# Patient Record
Sex: Male | Born: 1960 | Race: White | Hispanic: No | Marital: Single
Health system: Southern US, Community
[De-identification: ages and names within clinical notes are randomized; demographics above are authoritative.]

---

## 2019-02-13 ENCOUNTER — Emergency Department (HOSPITAL_COMMUNITY)
Admission: EM | Admit: 2019-02-13 | Discharge: 2019-02-13 | Disposition: A | Discharge status: Home / Self Care | Payer: Medicare Other | Attending: Emergency Medicine | Admitting: Emergency Medicine

## 2019-02-13 ENCOUNTER — Other Ambulatory Visit: Payer: Self-pay

## 2019-02-13 DIAGNOSIS — E875 Hyperkalemia: Secondary | ICD-10-CM | POA: Insufficient documentation

## 2019-02-13 DIAGNOSIS — N186 End stage renal disease: Secondary | ICD-10-CM

## 2019-02-13 DIAGNOSIS — Z992 Dependence on renal dialysis: Secondary | ICD-10-CM | POA: Insufficient documentation

## 2019-02-13 LAB — I-STAT CHEM 8, ED
BUN: 118 mg/dL — ABNORMAL HIGH (ref 6–20)
Calcium, Ion: 1.18 mmol/L (ref 1.15–1.40)
Chloride: 100 mmol/L (ref 98–111)
Creatinine, Ser: 13.6 mg/dL — ABNORMAL HIGH (ref 0.61–1.24)
Glucose, Bld: 86 mg/dL (ref 70–99)
HCT: 25 % — ABNORMAL LOW (ref 39.0–52.0)
Hemoglobin: 8.5 g/dL — ABNORMAL LOW (ref 13.0–17.0)
Potassium: 6.3 mmol/L (ref 3.5–5.1)
Sodium: 132 mmol/L — ABNORMAL LOW (ref 135–145)
TCO2: 21 mmol/L — ABNORMAL LOW (ref 22–32)

## 2019-02-13 MED ORDER — SODIUM ZIRCONIUM CYCLOSILICATE 10 G PO PACK
10.0000 g | PACK | Freq: Once | ORAL | Status: AC
  Administered 2019-02-13: 10 g via ORAL
  Filled 2019-02-13: qty 1

## 2019-02-13 NOTE — ED Provider Notes (Signed)
Emergency Department Provider Note   I have reviewed the triage vital signs and the nursing notes.   HISTORY  Chief Complaint Vascular Access Problem   HPI Norman Bright is a 58 y.o. male who presents to the emergency department today to receive dialysis.  Patient was diagnosed with coronavirus last week and was moved to [facility] here in [place] which is a Covid hospital apparently.  He missed dialysis on Friday, Saturday and today but is scheduled tomorrow morning.  They sent him here because he has had any labs done in a while.  Patient has no complaints.   No other associated or modifying symptoms.    No past medical history on file.  There are no active problems to display for this patient.  Allergies Patient has no allergy information on record.  No family history on file.  Social History Social History   Tobacco Use  . Smoking status: Not on file  Substance Use Topics  . Alcohol use: Not on file  . Drug use: Not on file    Review of Systems  All other systems negative except as documented in the HPI. All pertinent positives and negatives as reviewed in the HPI. ____________________________________________   PHYSICAL EXAM:  VITAL SIGNS: ED Triage Vitals  Enc Vitals Group     BP 02/13/19 1800 (!) 116/48     Pulse Rate 02/13/19 1800 61     Resp 02/13/19 1900 12     Temp --      Temp src --      SpO2 02/13/19 1744 99 %    Constitutional: Alert and oriented. Well appearing and in no acute distress. Eyes: Conjunctivae are normal. PERRL. EOMI. Head: Atraumatic. Nose: No congestion/rhinnorhea. Mouth/Throat: Mucous membranes are moist.  Oropharynx non-erythematous. Neck: No stridor.  No meningeal signs.   Cardiovascular: Normal rate, regular rhythm. Good peripheral circulation. Grossly normal heart sounds.   Respiratory: Normal respiratory effort.  No retractions. Lungs CTAB. Gastrointestinal: Soft and nontender. No distention.   Musculoskeletal: No lower extremity tenderness nor edema. No gross deformities of extremities. Neurologic:  Normal speech and language. No gross focal neurologic deficits are appreciated.  Skin:  Skin is warm, dry and intact. No rash noted.   ____________________________________________   LABS (all labs ordered are listed, but only abnormal results are displayed)  Labs Reviewed  I-STAT CHEM 8, ED - Abnormal; Notable for the following components:      Result Value   Sodium 132 (*)    Potassium 6.3 (*)    BUN 118 (*)    Creatinine, Ser 13.60 (*)    TCO2 21 (*)    Hemoglobin 8.5 (*)    HCT 25.0 (*)    All other components within normal limits   ____________________________________________  EKG  My ECG Read Indication:hyperkalemia EKG was personally contemporaneously reviewed by myself. Rate: 58 PR Interval: 120 QRS duration: 111 QT/QTC: 461/453 Axis: normal EKG: normal EKG, normal sinus rhythm, there are no previous tracings available for comparison. Other significant findings: no changes c/w hyperkalemia  ____________________________________________  INITIAL IMPRESSION / ASSESSMENT AND PLAN / ED COURSE  Discussed with Dr. Joelyn Oms and no ecg changes or resp symptoms, will give lokelma. Also stated he was able to check and the patient is on the schedule for dialysis in the morning.   Pertinent labs & imaging results that were available during my care of the patient were reviewed by me and considered in my medical decision making (see chart for details).  A medical screening exam was performed and I feel the patient has had an appropriate workup for their chief complaint at this time and likelihood of emergent condition existing is low. They have been counseled on decision, discharge, follow up and which symptoms necessitate immediate return to the emergency department. They or their family verbally stated understanding and agreement with plan and discharged in stable  condition.   ____________________________________________  FINAL CLINICAL IMPRESSION(S) / ED DIAGNOSES  Final diagnoses:  ESRD (end stage renal disease) ([place])  Hyperkalemia    MEDICATIONS GIVEN DURING THIS VISIT:  Medications  sodium zirconium cyclosilicate (LOKELMA) packet 10 g (10 g Oral Given 02/13/19 1925)     NEW OUTPATIENT MEDICATIONS STARTED DURING THIS VISIT:  There are no discharge medications for this patient.   Note:  This note was prepared with assistance of Dragon voice recognition software. Occasional wrong-word or sound-a-like substitutions may have occurred due to the inherent limitations of voice recognition software.   Merrily Pew, MD 02/13/19 [phone]

## 2019-02-13 NOTE — ED Notes (Signed)
ptar called pt is  Next called by dee

## 2019-02-13 NOTE — ED Triage Notes (Signed)
Pt BIB [facility] from [facility]. Pt here for dialysis. Per EMS pt missed dialysis Friday and Saturday but is scheduled for dialysis tomorrow. VSS. NAD.

## 2019-02-13 NOTE — ED Notes (Signed)
I Stat Chem8 with Potassium of 6.3, called to Dr. Dayna Barker.

## 2019-02-13 NOTE — ED Notes (Signed)
Attempted to call report X2 without success.

## 2019-02-13 NOTE — Discharge Instructions (Addendum)
Your potassium was slightly high however not a dangerous levels.  The medicine we gave you should temporarily hold you over until you can get your dialysis tomorrow.  However it is imperative that you do get it done tomorrow.

## 2019-03-08 ENCOUNTER — Emergency Department (HOSPITAL_COMMUNITY)
Admission: EM | Admit: 2019-03-08 | Discharge: 2019-03-08 | Disposition: A | Discharge status: Home / Self Care | Payer: Medicare Other | Attending: Emergency Medicine | Admitting: Emergency Medicine

## 2019-03-08 ENCOUNTER — Emergency Department (HOSPITAL_COMMUNITY): Payer: Medicare Other

## 2019-03-08 ENCOUNTER — Other Ambulatory Visit: Payer: Self-pay

## 2019-03-08 DIAGNOSIS — K529 Noninfective gastroenteritis and colitis, unspecified: Secondary | ICD-10-CM | POA: Diagnosis not present

## 2019-03-08 DIAGNOSIS — R10814 Left lower quadrant abdominal tenderness: Secondary | ICD-10-CM | POA: Insufficient documentation

## 2019-03-08 DIAGNOSIS — N186 End stage renal disease: Secondary | ICD-10-CM | POA: Insufficient documentation

## 2019-03-08 DIAGNOSIS — Z8709 Personal history of other diseases of the respiratory system: Secondary | ICD-10-CM | POA: Insufficient documentation

## 2019-03-08 DIAGNOSIS — Z992 Dependence on renal dialysis: Secondary | ICD-10-CM | POA: Insufficient documentation

## 2019-03-08 DIAGNOSIS — R197 Diarrhea, unspecified: Secondary | ICD-10-CM

## 2019-03-08 DIAGNOSIS — I959 Hypotension, unspecified: Secondary | ICD-10-CM | POA: Diagnosis present

## 2019-03-08 LAB — CBC WITH DIFFERENTIAL/PLATELET
Abs Immature Granulocytes: 0.01 10*3/uL (ref 0.00–0.07)
Basophils Absolute: 0 10*3/uL (ref 0.0–0.1)
Basophils Relative: 1 %
Eosinophils Absolute: 0.3 10*3/uL (ref 0.0–0.5)
Eosinophils Relative: 4 %
HCT: 29.7 % — ABNORMAL LOW (ref 39.0–52.0)
Hemoglobin: 9.3 g/dL — ABNORMAL LOW (ref 13.0–17.0)
Immature Granulocytes: 0 %
Lymphocytes Relative: 27 %
Lymphs Abs: 1.7 10*3/uL (ref 0.7–4.0)
MCH: 33.1 pg (ref 26.0–34.0)
MCHC: 31.3 g/dL (ref 30.0–36.0)
MCV: 105.7 fL — ABNORMAL HIGH (ref 80.0–100.0)
Monocytes Absolute: 0.6 10*3/uL (ref 0.1–1.0)
Monocytes Relative: 10 %
Neutro Abs: 3.6 10*3/uL (ref 1.7–7.7)
Neutrophils Relative %: 58 %
Platelets: 205 10*3/uL (ref 150–400)
RBC: 2.81 MIL/uL — ABNORMAL LOW (ref 4.22–5.81)
RDW: 17.8 % — ABNORMAL HIGH (ref 11.5–15.5)
WBC: 6.1 10*3/uL (ref 4.0–10.5)
nRBC: 0 % (ref 0.0–0.2)

## 2019-03-08 LAB — COMPREHENSIVE METABOLIC PANEL
ALT: 44 U/L (ref 0–44)
AST: 27 U/L (ref 15–41)
Albumin: 3.6 g/dL (ref 3.5–5.0)
Alkaline Phosphatase: 123 U/L (ref 38–126)
Anion gap: 13 (ref 5–15)
BUN: 16 mg/dL (ref 6–20)
CO2: 26 mmol/L (ref 22–32)
Calcium: 9.6 mg/dL (ref 8.9–10.3)
Chloride: 99 mmol/L (ref 98–111)
Creatinine, Ser: 5.61 mg/dL — ABNORMAL HIGH (ref 0.61–1.24)
GFR calc Af Amer: 12 mL/min — ABNORMAL LOW (ref >60)
GFR calc non Af Amer: 10 mL/min — ABNORMAL LOW (ref >60)
Glucose, Bld: 97 mg/dL (ref 70–99)
Potassium: 3.4 mmol/L — ABNORMAL LOW (ref 3.5–5.1)
Sodium: 138 mmol/L (ref 135–145)
Total Bilirubin: 0.4 mg/dL (ref 0.3–1.2)
Total Protein: 7.5 g/dL (ref 6.5–8.1)

## 2019-03-08 LAB — LACTIC ACID, PLASMA: Lactic Acid, Venous: 1.7 mmol/L (ref 0.5–1.9)

## 2019-03-08 LAB — LIPASE, BLOOD: Lipase: 34 U/L (ref 11–51)

## 2019-03-08 NOTE — ED Provider Notes (Signed)
[place] EMERGENCY DEPARTMENT Provider Note   CSN: [id number] Arrival date & time: 03/08/19  [id number]     History Chief Complaint  Patient presents with   Hypotension   covid    recent covid positive last week- neg yesterday per pt    Norman Bright is a 58 y.o. male.  58 year old male with past medical history including ESRD on HD who p/w low blood pressure.  Patient is a poor historian.  Patient arrives from his nursing facility where staff reported that the patient's blood pressure was low yesterday evening so they held his blood pressure medications but it has continued to be low this morning.  Patient had his usual dialysis yesterday.  Patient has had profuse diarrhea on arrival to the ED and states that he has had this problem before, usually takes Imodium for it.  Facility [facility] documents he is on immodium prn and probiotic daily.  He denies any abdominal pain currently and denies any vomiting, cough, or breathing problems.  Of note, he tested positive for COVID-19 last month and has been on the COVID unit at nursing facility.   The history is provided by the patient, the EMS personnel and the nursing home.       No past medical history on file.  There are no problems to display for this patient.   * The histories are not reviewed yet. Please review them in the "History" navigator section and refresh this [place].   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  Social History   Tobacco Use   Smoking status: Not on file  Substance Use Topics   Alcohol use: Not on file   Drug use: Not on file    Home Medications Prior to Admission medications   Not on File    Allergies    Patient has no allergy information on record.  Review of Systems   Review of Systems All other systems reviewed and are negative except that which was mentioned in HPI  Physical Exam Updated Vital Signs BP (!)  123/57    Pulse (!) 57    Temp 98.3 F (36.8 C) (Oral)    Resp (!) 25    SpO2 100%   Physical Exam Vitals and nursing note reviewed.  Constitutional:      General: He is not in acute distress.    Appearance: He is well-developed.     Comments: Chronically ill appearing, comfortable  HENT:     Head: Normocephalic and atraumatic.  Eyes:     Conjunctiva/sclera: Conjunctivae normal.     Comments: Inversion of L lower eyelid  Cardiovascular:     Rate and Rhythm: Normal rate and regular rhythm.     Heart sounds: Normal heart sounds.  Pulmonary:     Effort: Pulmonary effort is normal.     Breath sounds: Normal breath sounds.  Abdominal:     General: Bowel sounds are normal. There is no distension.     Palpations: Abdomen is soft.     Tenderness: There is abdominal tenderness (LLQ).  Musculoskeletal:     Cervical back: Neck supple.     Comments: B/l BKA and amputation of L 2nd and 3rd fingers  Skin:    General: Skin is warm and dry.  Neurological:     Mental Status: He is alert and oriented to person, place, and time.     Comments: Fluent speech  ED Results / Procedures / Treatments   Labs (all labs ordered are listed, but only abnormal results are displayed) Labs Reviewed  COMPREHENSIVE METABOLIC PANEL - Abnormal; Notable for the following components:      Result Value   Potassium 3.4 (*)    Creatinine, Ser 5.61 (*)    GFR calc non Af Amer 10 (*)    GFR calc Af Amer 12 (*)    All other components within normal limits  CBC WITH DIFFERENTIAL/PLATELET - Abnormal; Notable for the following components:   RBC 2.81 (*)    Hemoglobin 9.3 (*)    HCT 29.7 (*)    MCV 105.7 (*)    RDW 17.8 (*)    All other components within normal limits  GI PATHOGEN PANEL BY PCR, STOOL  C DIFFICILE QUICK SCREEN W PCR REFLEX  LIPASE, BLOOD  LACTIC ACID, PLASMA    EKG EKG Interpretation  Date/Time:  Wednesday March 08 2019 07:54:44 EST Ventricular Rate:  61 PR Interval:    QRS  Duration: 107 QT Interval:  453 QTC Calculation: 457 R Axis:   72 Text Interpretation: Sinus rhythm Short PR interval Abnormal R-wave progression, early transition Nonspecific repol abnormality, diffuse leads Baseline wander in lead(s) V2 artifact limiting interpretation but no obvious significant changes from previous Confirmed by Theotis Burrow [phone]) on 03/08/2019 9:35:52 AM   Radiology CT Abdomen Pelvis Wo Contrast  Result Date: 03/08/2019 CLINICAL DATA:  Left lower quadrant pain and diarrhea EXAM: CT ABDOMEN AND PELVIS WITHOUT CONTRAST TECHNIQUE: Multidetector CT imaging of the abdomen and pelvis was performed following the standard protocol without oral or IV contrast. COMPARISON:  None. FINDINGS: Lower chest: There is ill-defined opacity in the right lower lobe, incompletely visualized, suspicious for pneumonia. Visualized lung bases otherwise are clear except for slight posterior left base atelectasis. There are multiple foci of coronary artery calcification. Hepatobiliary: No focal liver lesions are evident on this noncontrast enhanced study. Gallbladder is not appreciable. There is no biliary duct dilatation. Pancreas: There is no pancreatic mass or inflammatory focus. Spleen: No splenic lesions are evident. Adrenals/Urinary Tract: Adrenals bilaterally appear normal. Kidneys appear small and atrophic bilaterally. There is extensive renal artery calcification including multiple peripheral arterial calcifications in each kidney. There is no renal hydronephrosis on either side. There is a 1 x 1 cm cyst in each kidney. There is slight perinephric stranding on each side. There is no evident renal or ureteral calculus on either side. The urinary bladder is midline. Only a small amount of urine is seen in the bladder. Urinary bladder wall appears mildly thickened. Stomach/Bowel: There is a balloon catheter within the rectum. Rectal wall is not appreciably thickened. There is fluid throughout most  bowel loops. There is no appreciable diverticular disease. There is mild wall thickening involving several loops of proximal jejunum. There is no frank bowel obstruction. There appears to have been previous right-sided colectomy with anastomosis patent. No free air or portal venous air evident. Vascular/Lymphatic: There is no abdominal aortic aneurysm. There is atherosclerotic calcification in the aorta and common iliac arteries. There are multiple peripheral arterial calcifications throughout the abdomen and pelvis. Note that multiple calcifications are noted in peripheral hepatic arterial vessels, splenic artery, and renal arteries. No adenopathy is evident in the abdomen or pelvis. Reproductive: Prostate and seminal vesicles are normal in size and contour. No pelvic mass evident. Other: No abscess or ascites is evident in the abdomen or pelvis. There is scarring in the anterior abdominal wall. Musculoskeletal: No  blastic or lytic bone lesions are evident. No intramuscular lesions evident. IMPRESSION: 1. No diverticulitis evident. Fluid noted in multiple bowel loops, likely due to enterocolitis. Mild wall thickening and several loops of proximal jejunum support suspicion of enteritis. No bowel obstruction evident. Apparent previous right colectomy with anastomosis patent. 2. Apparent pneumonia in the right lower lobe, incompletely visualized. Appearance raises question of atypical organism pneumonia. 3. Kidneys appear atrophic; suspect chronic renal failure. No hydronephrosis. No renal or ureteral calculi. Urinary bladder is largely contracted. Suspect a degree of cystitis given mild urinary bladder wall thickening. 4. Widespread arterial vascular calcification; this finding may be associated with renal failure. Aortic Atherosclerosis (ICD10-I70.0). Electronically Signed   By: Lowella Grip III M.D.   On: 03/08/2019 11:07    Procedures Procedures (including critical care time)  Medications Ordered in  ED Medications - No data to display  ED Course  I have reviewed the triage vital signs and the nursing notes.  Pertinent labs & imaging results that were available during my care of the patient were reviewed by me and considered in my medical decision making (see chart for details).    MDM Rules/Calculators/A&P                      Pt comfortable and alert on exam. BP 115/77, HR 60. He had LLQ abd tenderness although didn't report abd pain. Profuse non-bloody diarrhea per nursing staff. Labs show normal lactate, K 3.4, normal WBC count, Hgb 9.3 similar to previous. Obtained CT abd/pelvis given abd tenderness, to evaluate for diverticulitis.  CT shows no evidence of diverticulitis but he does have fluid-filled bowel loops and wall thickening suggestive of enterocolitis, enteritis.  I ordered GI panel and C. difficile studies but patient had no episodes of diarrhea after initial arrival and we were unable to send the studies.  I attempted to speak with his nurse at [facility] but we were cut off midway through conversation and I was unable to get in touch with anyone else there.  She did say that his diarrhea has been a chronic problem.  I have explained to him that he needs stool studies sent by his primary provider and have written this on his discharge paperwork.  His blood pressure has remained normal throughout his ED course and he is comfortable on reassessment.  Patient discharged back to his nursing facility. Final Clinical Impression(s) / ED Diagnoses Final diagnoses:  Enterocolitis  Diarrhea, unspecified type    Rx / DC Orders ED Discharge Orders    None       Farley Crooker, Wenda Overland, MD 03/08/19 1512

## 2019-03-08 NOTE — ED Triage Notes (Signed)
To ED via [facility] from 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

## 2019-03-08 NOTE — Discharge Instructions (Signed)
Norman Bright BLOOD PRESSURE WAS NORMAL IN THE ED. HIS ABDOMINAL CT SHOWED ENTERCOLITIS, OR INFLAMMATION OF SMALL BOWEL, WHICH IS LIKELY RELATED TO HIS DIARRHEA. HE NEEDS STOOL STUDIES ORDERED TO TEST FOR GASTROINTESTINAL VIRUSES AND BACTERIA, INCLUDING C. DIFF. PLEASE HAVE HIS PRIMARY CARE PROVIDER SEND THESE STUDIES AS SOON AS POSSIBLE.  RETURN TO ER IF ANY FEVER, SEVERE ABDOMINAL PAIN, OR BLOODY STOOLS.

## 2019-03-08 NOTE — ED Notes (Signed)
Attempted report to [facility], transferred to nursing staff from main line however, no answer and no answering service. Phone just rang. Will try again.

## 2019-03-08 NOTE — ED Notes (Signed)
flexiseal was ordered and has been placed at this time.

## 2019-03-08 NOTE — ED Notes (Signed)
PTAR

## 2019-03-08 NOTE — ED Notes (Signed)
Pt was covered in stool on arrival with EMS. This tech cleaned pt and put new brief on pt along with clean sheets and gown.

## 2020-10-21 DEATH — deceased

## 2021-07-15 IMAGING — CT CT ABD-PELV W/O CM
2 of 4 series · 14 of 46 positions shown, 16 images · non-contrast
Comparison: None.

CLINICAL DATA: Left lower quadrant pain and diarrhea

EXAM:
CT ABDOMEN AND PELVIS WITHOUT CONTRAST
TECHNIQUE: Multidetector CT imaging of the abdomen and pelvis was performed
following the standard protocol without oral or IV contrast.

[Series 3: a/p w/o 5mm · axial · non-contrast · 0.76mm/px · z∈[+514,+949]mm · 11 of 105 slices shown, 13 images]
[im 9/105  soft-tissue]
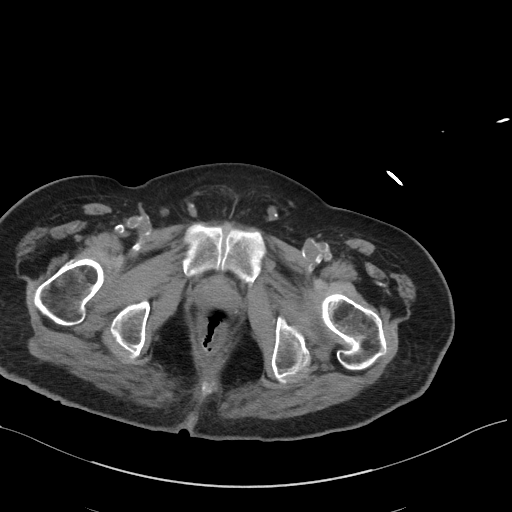
[im 9/105  bone]
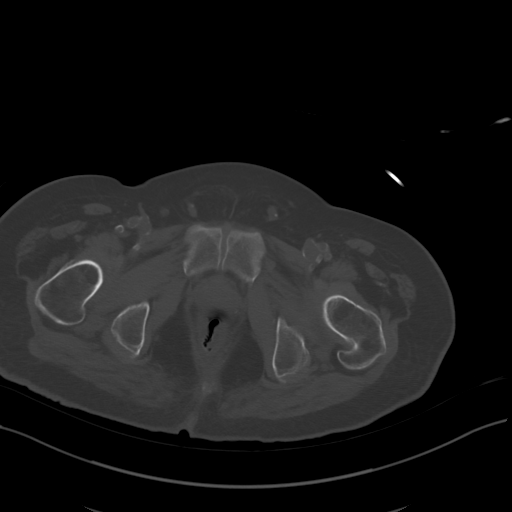
[im 18/105  soft-tissue]
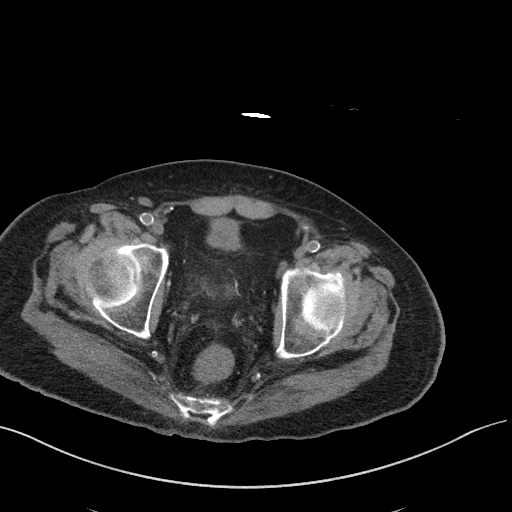
[im 27/105  soft-tissue]
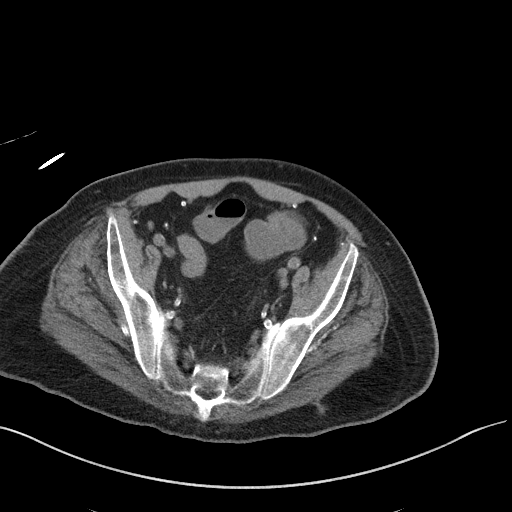
[im 35/105  soft-tissue]
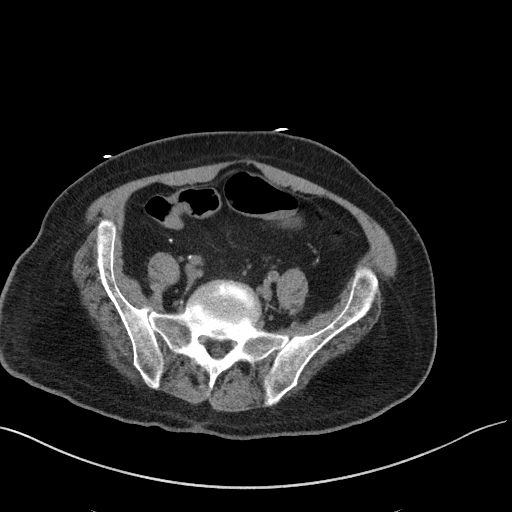
[im 44/105  soft-tissue]
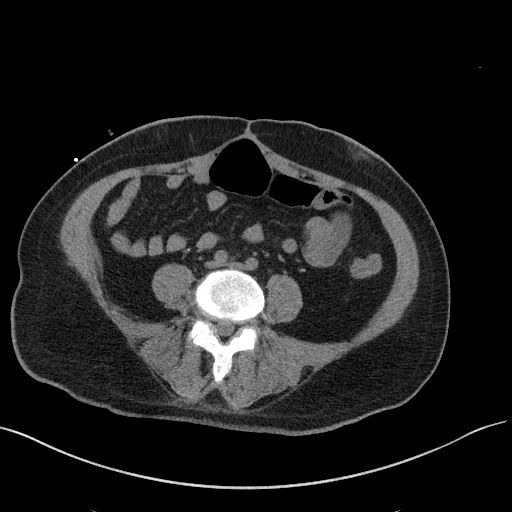
[im 53/105  soft-tissue]
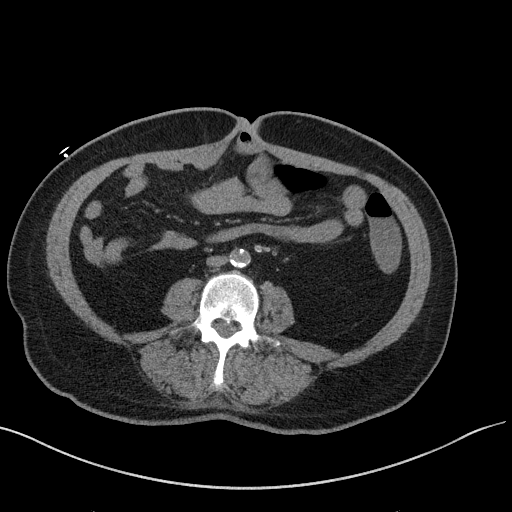
[im 61/105  soft-tissue]
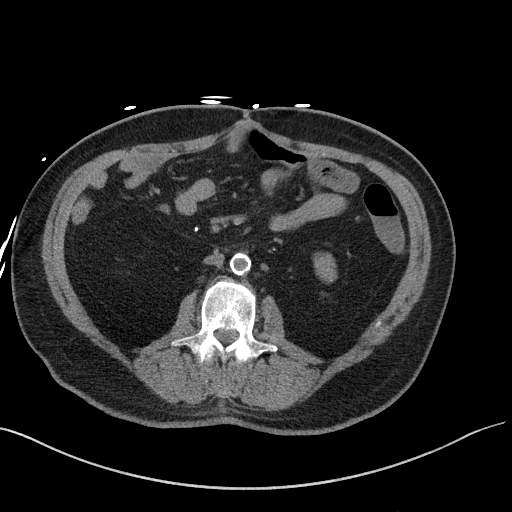
[im 70/105  soft-tissue]
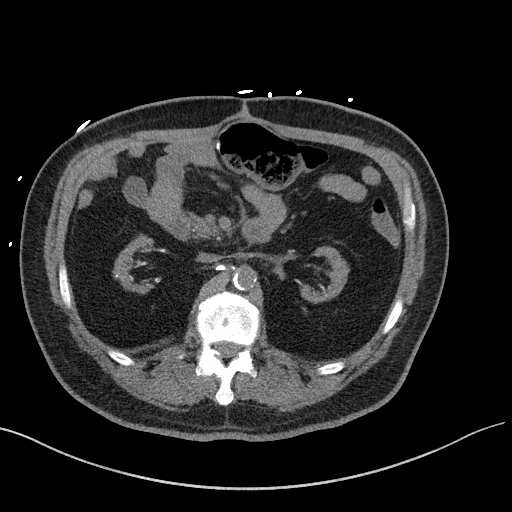
[im 79/105  soft-tissue]
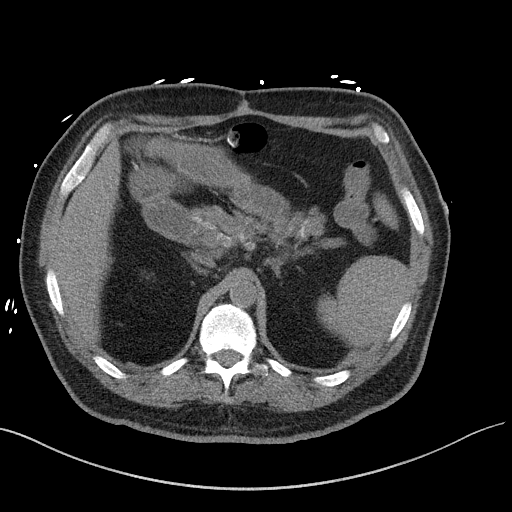
[im 79/105  bone]
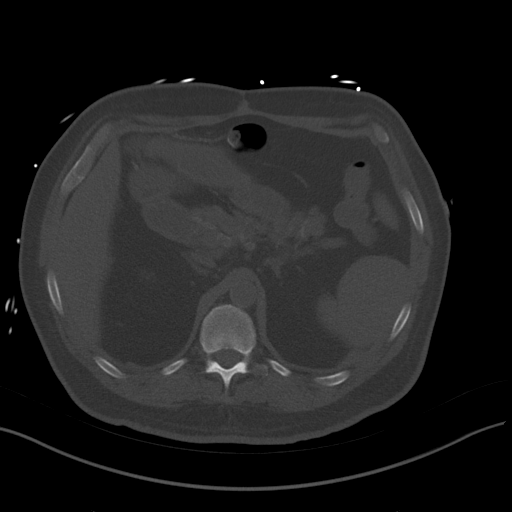
[im 87/105  soft-tissue]
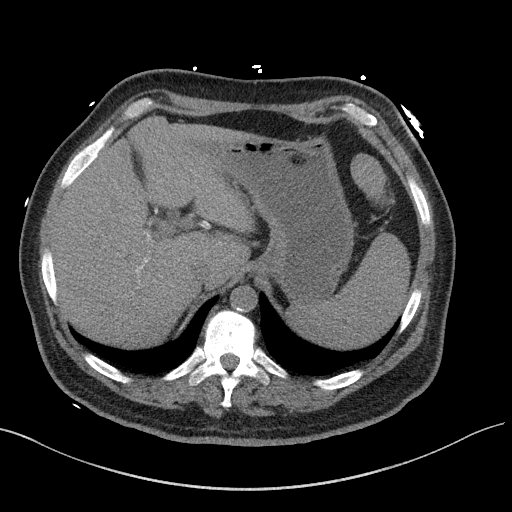
[im 96/105  soft-tissue]
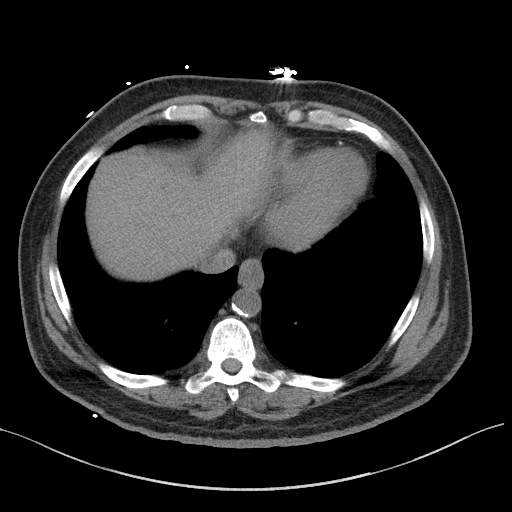

[Series 6: a/p w/o cor · coronal · non-contrast · 0.76mm/px · 3 of 165 slices shown]
[im 55/165  soft-tissue]
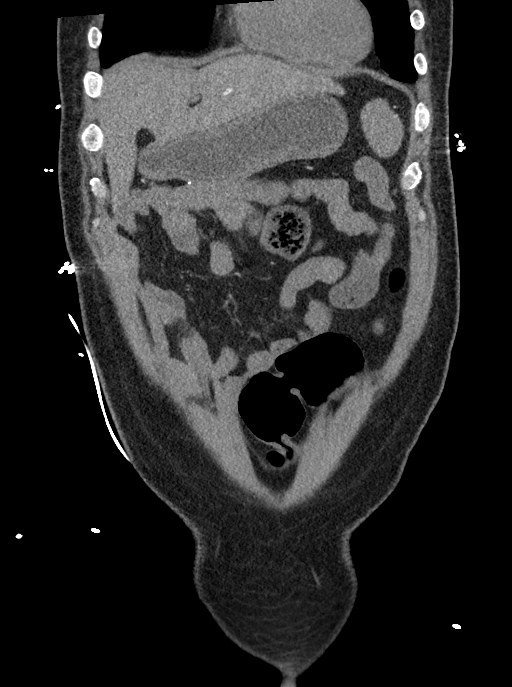
[im 73/165  soft-tissue]
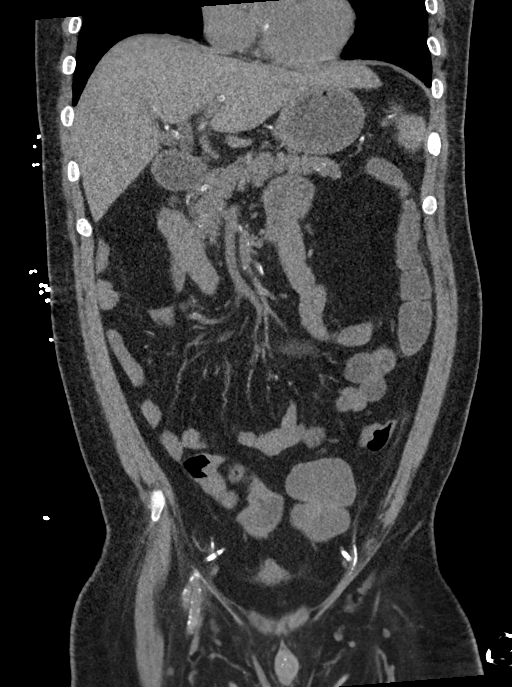
[im 92/165  soft-tissue]
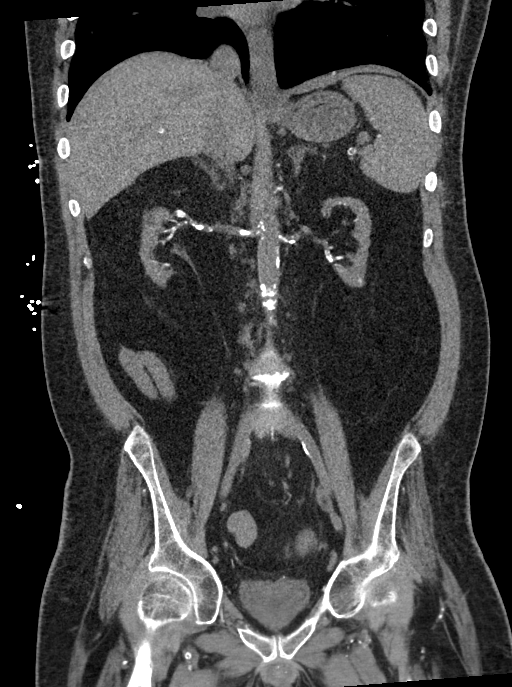

[14 of 46 positions shown; findings below may reference images not displayed]

FINDINGS: Lower chest: There is ill-defined opacity in the right lower lobe,
incompletely visualized, suspicious for pneumonia. Visualized lung
bases otherwise are clear except for slight posterior left base
atelectasis. There are multiple foci of coronary artery
calcification.

Hepatobiliary: No focal liver lesions are evident on this
noncontrast enhanced study. Gallbladder is not appreciable. There is
no biliary duct dilatation.

Pancreas: There is no pancreatic mass or inflammatory focus.

Spleen: No splenic lesions are evident.

Adrenals/Urinary Tract: Adrenals bilaterally appear normal. Kidneys
appear small and atrophic bilaterally. There is extensive renal
artery calcification including multiple peripheral arterial
calcifications in each kidney. There is no renal hydronephrosis on
either side. There is a 1 x 1 cm cyst in each kidney. There is
slight perinephric stranding on each side. There is no evident renal
or ureteral calculus on either side. The urinary bladder is midline.
Only a small amount of urine is seen in the bladder. Urinary bladder
wall appears mildly thickened.

Stomach/Bowel: There is a balloon catheter within the rectum. Rectal
wall is not appreciably thickened. There is fluid throughout most
bowel loops. There is no appreciable diverticular disease. There is
mild wall thickening involving several loops of proximal jejunum.
There is no frank bowel obstruction. There appears to have been
previous right-sided colectomy with anastomosis patent. No free air
or portal venous air evident.

Vascular/Lymphatic: There is no abdominal aortic aneurysm. There is
atherosclerotic calcification in the aorta and common iliac
arteries. There are multiple peripheral arterial calcifications
throughout the abdomen and pelvis. Note that multiple calcifications
are noted in peripheral hepatic arterial vessels, splenic artery,
and renal arteries. No adenopathy is evident in the abdomen or
pelvis.

Reproductive: Prostate and seminal vesicles are normal in size and
contour. No pelvic mass evident.

Other: No abscess or ascites is evident in the abdomen or pelvis.
There is scarring in the anterior abdominal wall.

Musculoskeletal: No blastic or lytic bone lesions are evident. No
intramuscular lesions evident.
IMPRESSION: 1. No diverticulitis evident. Fluid noted in multiple bowel loops,
likely due to enterocolitis. Mild wall thickening and several loops
of proximal jejunum support suspicion of enteritis. No bowel
obstruction evident. Apparent previous right colectomy with
anastomosis patent.

2. Apparent pneumonia in the right lower lobe, incompletely
visualized. Appearance raises question of atypical organism
pneumonia.

3. Kidneys appear atrophic; suspect chronic renal failure. No
hydronephrosis. No renal or ureteral calculi. Urinary bladder is
largely contracted. Suspect a degree of cystitis given mild urinary
bladder wall thickening.

4. Widespread arterial vascular calcification; this finding may be
associated with renal failure.

Aortic Atherosclerosis ([id number]-YEX.X).
